# Patient Record
Sex: Male | Born: 1985 | Race: Black or African American | Hispanic: No | Marital: Single | Smoking: Current every day smoker
Health system: Southern US, Community
[De-identification: ages and names within clinical notes are randomized; demographics above are authoritative.]

## PROBLEM LIST (undated history)

## (undated) HISTORY — PX: HAND SURGERY: SHX662

---

## 2004-10-30 ENCOUNTER — Emergency Department: Payer: Self-pay | Admitting: Emergency Medicine

## 2007-06-29 ENCOUNTER — Emergency Department (HOSPITAL_COMMUNITY): Admission: EM | Admit: 2007-06-29 | Discharge: 2007-06-29 | Payer: Self-pay | Admitting: Emergency Medicine

## 2014-08-03 ENCOUNTER — Emergency Department (HOSPITAL_COMMUNITY)
Admission: EM | Admit: 2014-08-03 | Discharge: 2014-08-03 | Disposition: A | Discharge status: Home / Self Care | Payer: Self-pay | Attending: Emergency Medicine | Admitting: Emergency Medicine

## 2014-08-03 ENCOUNTER — Encounter (HOSPITAL_COMMUNITY): Payer: Self-pay

## 2014-08-03 DIAGNOSIS — K029 Dental caries, unspecified: Secondary | ICD-10-CM | POA: Insufficient documentation

## 2014-08-03 DIAGNOSIS — K047 Periapical abscess without sinus: Secondary | ICD-10-CM | POA: Insufficient documentation

## 2014-08-03 DIAGNOSIS — Z720 Tobacco use: Secondary | ICD-10-CM | POA: Insufficient documentation

## 2014-08-03 MED ORDER — HYDROCODONE-ACETAMINOPHEN 5-325 MG PO TABS
1.0000 | ORAL_TABLET | Freq: Once | ORAL | Status: AC
  Administered 2014-08-03: 1 via ORAL
  Filled 2014-08-03: qty 1

## 2014-08-03 MED ORDER — CLINDAMYCIN HCL 150 MG PO CAPS: 150.0000 mg | ORAL_CAPSULE | Freq: Four times a day (QID) | ORAL | Status: DC

## 2014-08-03 MED ORDER — HYDROCODONE-ACETAMINOPHEN 5-325 MG PO TABS: ORAL_TABLET | ORAL | Status: DC

## 2014-08-03 NOTE — Discharge Instructions (Signed)
Dental Abscess °A dental abscess is a collection of infected fluid (pus) from a bacterial infection in the inner part of the tooth (pulp). It usually occurs at the end of the tooth's root.  °CAUSES  °· Severe tooth decay. °· Trauma to the tooth that allows bacteria to enter into the pulp, such as a broken or chipped tooth. °SYMPTOMS  °· Severe pain in and around the infected tooth. °· Swelling and redness around the abscessed tooth or in the mouth or face. °· Tenderness. °· Pus drainage. °· Bad breath. °· Bitter taste in the mouth. °· Difficulty swallowing. °· Difficulty opening the mouth. °· Nausea. °· Vomiting. °· Chills. °· Swollen neck glands. °DIAGNOSIS  °· A medical and dental history will be taken. °· An examination will be performed by tapping on the abscessed tooth. °· X-rays may be taken of the tooth to identify the abscess. °TREATMENT °The goal of treatment is to eliminate the infection. You may be prescribed antibiotic medicine to stop the infection from spreading. A root canal may be performed to save the tooth. If the tooth cannot be saved, it may be pulled (extracted) and the abscess may be drained.  °HOME CARE INSTRUCTIONS °· Only take over-the-counter or prescription medicines for pain, fever, or discomfort as directed by your caregiver. °· Rinse your mouth (gargle) often with salt water (¼ tsp salt in 8 oz [250 ml] of warm water) to relieve pain or swelling. °· Do not drive after taking pain medicine (narcotics). °· Do not apply heat to the outside of your face. °· Return to your dentist for further treatment as directed. °SEEK MEDICAL CARE IF: °· Your pain is not helped by medicine. °· Your pain is getting worse instead of better. °SEEK IMMEDIATE MEDICAL CARE IF: °· You have a fever or persistent symptoms for more than 2-3 days. °· You have a fever and your symptoms suddenly get worse. °· You have chills or a very bad headache. °· You have problems breathing or swallowing. °· You have trouble  opening your mouth. °· You have swelling in the neck or around the eye. °Document Released: 08/22/2005 Document Revised: 05/16/2012 Document Reviewed: 11/30/2010 °ExitCare® Patient Information ©2015 [employer]. This information is not intended to replace advice given to you by your health care provider. Make sure you discuss any questions you have with your health care provider. ° ° °Emergency Department Resource Guide °1) Find a Doctor and Pay Out of Pocket °Although you won't have to find out who is covered by your insurance plan, it is a good idea to ask around and get recommendations. You will then need to call the office and see if the doctor you have chosen will accept you as a new patient and what types of options they offer for patients who are self-pay. Some doctors offer discounts or will set up payment plans for their patients who do not have insurance, but you will need to ask so you aren't surprised when you get to your appointment. ° °2) Contact Your Local Health Department °Not all health departments have doctors that can see patients for sick visits, but many do, so it is worth a call to see if yours does. If you don't know where your local health department is, you can check in your phone book. The CDC also has a tool to help you locate your state's health department, and many state websites also have listings of all of their local health departments. ° °3) Find a Walk-in   Clinic °If your illness is not likely to be very severe or complicated, you may want to try a walk in clinic. These are popping up all over the country in pharmacies, drugstores, and shopping centers. They're usually staffed by nurse practitioners or physician assistants that have been trained to treat common illnesses and complaints. They're usually fairly quick and inexpensive. However, if you have serious medical issues or chronic medical problems, these are probably not your best option. ° °No Primary Care Doctor: °- Call  [facility] at  832-8000 - they can help you locate a primary care doctor that  accepts your insurance, provides certain services, etc. °- Physician Referral Service- 1-800-533-3463 ° °Chronic Pain Problems: °Organization         Address  Phone   Notes  °[facility]  (336) 297-2271 Patients need to be referred by their primary care doctor.  ° °Medication Assistance: °Organization         Address  Phone   Notes  °[place] [facility] [address] °[place], [place] [address] (336) 641-8030 --Must be a resident of [place] °-- Must have NO insurance coverage whatsoever (no Medicaid/ Medicare, etc.) °-- The pt. MUST have a primary care doctor that directs their care regularly and follows them in the community °  °[facility]  (866) 331-1348   °[employer]  (888) 892-1162   ° °Agencies that provide inexpensive medical care: °Organization         Address  Phone   Notes  °[facility]  (336) 832-8035   °[facility]    (336) 832-7272   °[facility] [address] °[place], [place] [address] (336) 832-4777   °[facility] [address], °[place] (336) 271-4999   °[facility]    (336) 373-0678   °[facility]    (336) 272-1050   °[facility] ° [address], [place] Phone:  (336) 832-4444, Fax:  (336) 832-4440 Hours of Operation:  9 am - 6 pm, M-F.  Also accepts Medicaid/Medicare and self-pay.  °[facility] ° [address], [place] Phone: (336) 832-3150, Fax: (336) 832-3151. Hours of Operation:  8:30 am - 5:30 pm, M-F.  Also accepts Medicaid and self-pay.  °[facility] [place] [address], [place] Phone: (336) 878-6027   °[facility] [address], [place], [place] (336)723-1848, Ext. 123 Mondays & Thursdays: 7-9 AM.  First 15 patients are seen on a first come, first serve  basis. °  ° °Medicaid-accepting [place] Providers: ° °Organization         Address  Phone   Notes  °[facility] [address], [place] (336) 641-2100 Also accepts self-pay patients.  °[facility] [address], [place] ° (336) 856-9996   °[facility] [address], [place] (336) 288-8857   °[facility] [address] [place] [address], [place] (336) 299-7000   °Veita Bland [address], [place]  ° (336) 373-1557 Only accepts [place] Access Medicaid patients after they have their name applied to their card.  ° °Self-Pay (no insurance) in [place]: ° °Organization         Address  Phone   Notes  °Sickle Cell Patients, [facility] [address], [place] (  336) 832-1970   °[facility] [address], [place] (336) 832-4400   °[facility] ° [address],  (336) 992-4800   °[facility]/Dr. Osei-Bonsu ° [address] [place] [address], [place] or [address], [place] (336) 841-8500 Phone number for both [place] and [place] locations is the same.  °[facility] [address], [place] (336) 299-0000   °[facility] [address] [place] [address], [place] or [address] (336) 852-7530 °(336) 878-2260   °[facility] [address], [place] (336) 350-1642, phone; (336) 294-5005, fax Sees patients 1st and 3rd Saturday of every month.  Must not qualify for public or private insurance (i.e. Medicaid, Medicare, [place] Health Choice, Veterans' Benefits) • Household income should be no more than 200% of the poverty level •The clinic cannot treat you if you are pregnant or think you are pregnant • Sexually transmitted diseases are not treated at the clinic.  ° ° °Dental Care: °Organization         Address  Phone  Notes  °[place]  [facility] [address], [place] (336) 641-6152 Accepts children up to age 21 who are enrolled in Medicaid or [place] Health Choice; pregnant women with a Medicaid card; and children who have applied for Medicaid or [place] Health Choice, but were declined, whose parents can pay a reduced fee at time of service.  °[facility] [place]  [address], [place] (336) 641-7733 Accepts children up to age 21 who are enrolled in Medicaid or [place] Health Choice; pregnant women with a Medicaid card; and children who have applied for Medicaid or [place] Health Choice, but were declined, whose parents can pay a reduced fee at time of service.  °[facility] ° [address], [place] (336) 641-4533 Patients are seen by appointment only. Walk-ins are not accepted. [facility] will see patients 18 years of age and older. °Monday - Tuesday (8am-5pm) °Most Wednesdays (8:30-5pm) °$30 per visit, cash only  °[facility] ° [address], [place] (336) 641-4533 Patients are seen by appointment only. Walk-ins are not accepted. [facility] will see patients 18 years of age and older. °One Wednesday Evening (Monthly: Volunteer Based).  $30 per visit, cash only  °[facility]  (919) 537-3737 for adults; Children under age 4, call Graduate Pediatric Dentistry at (919) [phone]. Children aged 4-14, please call (919) [phone] to request a pediatric application. ° Dental services are provided in all areas of dental care including fillings, crowns and bridges, complete and partial dentures, implants, gum treatment, root canals, and extractions. Preventive care is also provided. Treatment is provided to both adults and children. °Patients are selected via a lottery and there is often a waiting list. °  °[facility] [address], ° ° (336) 763-8833  [email or web address].drcivils.com °  °[facility] [address], [place], [place] (336)723-1848, Ext. 123 Second and Fourth Thursday of each month, opens at 6:30 AM; Clinic ends at 9 AM.  Patients are seen on a first-come first-served basis, and a limited number are seen during each clinic.  ° °[facility] ° [address], [place], [place] (336) 723-7904   Eligibility Requirements °You must have lived in [place], or [place] counties   for at least the last three months. °  You cannot be eligible for state or federal sponsored healthcare insurance, including Veterans Administration, Medicaid, or Medicare. °  You generally cannot be eligible for healthcare insurance through your employer.  °  How to apply: °Eligibility screenings are held every Tuesday and Wednesday afternoon from 1:00 pm until 4:00 pm. You do not need an appointment for the interview!  °[facility] [address], [place], [place] 336-631-2330   °[facility]  336-342-8273   °[facility]  336-703-3100   °[facility]  336-570-6415   ° °Behavioral Health Resources in the Community: °Intensive Outpatient Programs °Organization         Address  Phone  Notes  °[facility] [address], [place], [place] 336-878-6098   °[facility] [address], [place], [place] 336-832-9800   °[facility] [address], [place], [place] ° 336-882-2125   °[facility] [address],  °[place], [place] 1-800-853-5163 or 336-641-4981   °Substance Abuse Resources °Organization         Address  Phone  Notes  °[facility]  336-882-2125   °[facility]  336-784-9470   °[facility]  336-285-9073   °[facility]  336-845-3988   °Residential & Outpatient Substance Abuse Program  1-800-659-3381   °Psychological Services °Organization          Address  Phone  Notes  °[facility]  336- 832-9600   °[facility]  336- 378-7881   °[facility] [address], [place] 1-800-853-5163 or 336-641-4981   ° °Mobile Crisis Teams °Organization         Address  Phone  Notes  °[facility], Mobile Crisis Care Unit  1-877-626-1772   °Assertive °[facility] ° [address] [place], [place] 336-834-9664   °Sharon DeEsch [address] °[place] 336-554-5454   ° °Self-Help/Support Groups °Organization         Address  Phone             Notes  °[facility] [place] - variety of support groups  336- 373-1402 Call for more information  °Narcotics Anonymous (NA), [facility] [address], °[place]  2 meetings at this location  ° °Residential Treatment Programs °Organization         Address  Phone  Notes  °[facility] [address],    °[place]  1-866-801-8205   °[facility] ° [address], [place], [place] 704-293-8524   °[facility] [address], [place] 336-845-3988 Admissions: 8am-3pm M-F  °[facility] [address]    °[place], [place] 336-841-1104   °[facility] [address], [place], [place] 336-379-7146   °[facility] [address]  °[place], [place] 336-285-9073   °[facility] - Intensive Outpatient [address], [place], [place] 336-852-3033   °[facility] [address]  °[place], [place] 1-877-615-2722 or 336-784-9470   °[facility] [address] , [place] 336-227-7417 Accepts Medicaid  °[facility] [address]  °[place] 1-800-659-3381 Substance Abuse/Addiction Treatment  ° °[place] Behavioral Health Resources °Organization         Address  Phone  Notes  °[facility]  (888)   581-9988   °Julie Brannon, PhD [address] [place], [place]   (336) 349-5553 or (336) 951-0000   ° Behavioral   [address] °[place], [place] (336) 349-4454   °[facility] [address], [place], [place] (336) 342-8316 Insurance/Medicaid/sponsorship through [facility]  °[facility] [address]                                    [place], [place] (336) 342-8316 Therapy/tele-psych/case  °[facility] [address]  ° [place], [place] (336) 349-2233    °Dr. Arfeen  (336) 349-4544   °[facility] [place]  [employer] [place] [facility] 1) [address], [place] °2) [address], [place] °3)  [address], [place] (336) 349-3220 °(336) 342-7768 ° °(336) 342-8140   °[place] [facility] (336) 342-1394 or (336) 342-3537 (After Hours)    ° ° ° °

## 2014-08-03 NOTE — ED Provider Notes (Signed)
CSN: [id number]     Arrival date & time 08/03/14  1850 History   First MD Initiated Contact with Patient 08/03/14 2002     Chief Complaint  Patient presents with  . Oral Swelling     (Consider location/radiation/quality/duration/timing/severity/associated sxs/prior Treatment) HPI   Bernard Tucker is a 28 y.o. male who presents to the Emergency Department complaining of dental pain and swelling to his left lower jaw.  He reports dental pain for several days, but woke up with facial swelling this morning.  He has been taking OTC medications without relief.  He also reports noticing a "knot" to the left lower gums.  He denies throat pain, difficult swallowing or breathing, fever or neck pain.     History reviewed. No pertinent past medical history. Past Surgical History  Procedure Laterality Date  . Hand surgery     No family history on file. History  Substance Use Topics  . Smoking status: Current Every Day Smoker -- 0.50 packs/day    Types: Cigarettes  . Smokeless tobacco: Not on file  . Alcohol Use: No    Review of Systems  Constitutional: Negative for fever and appetite change.  HENT: Positive for dental problem. Negative for congestion, facial swelling, sore throat, trouble swallowing and voice change.   Eyes: Negative for pain and visual disturbance.  Musculoskeletal: Negative for neck pain and neck stiffness.  Neurological: Negative for dizziness, facial asymmetry and headaches.  Hematological: Negative for adenopathy.  All other systems reviewed and are negative.     Allergies  Review of patient's allergies indicates no known allergies.  Home Medications   Prior to Admission medications   Not on File   BP 126/74 mmHg  Pulse 81  Temp(Src) 98.2 F (36.8 C) (Oral)  Resp 20  Ht 6\' 5"  (1.956 m)  Wt 170 lb (77.111 kg)  BMI 20.15 kg/m2  SpO2 100% Physical Exam  Constitutional: He is oriented to person, place, and time. He appears well-developed and  well-nourished. No distress.  HENT:  Head: Normocephalic and atraumatic.  Right Ear: Tympanic membrane and ear canal normal.  Left Ear: Tympanic membrane and ear canal normal.  Mouth/Throat: Uvula is midline, oropharynx is clear and moist and mucous membranes are normal. No oral lesions. No trismus in the jaw. Dental caries present. No dental abscesses or uvula swelling.    Dental caries of the   Localized facial swelling left lower jaw, no obvious dental abscess, trismus, or sublingual abnml.    Neck: Normal range of motion. Neck supple.  Cardiovascular: Normal rate, regular rhythm and normal heart sounds.   No murmur heard. Pulmonary/Chest: Effort normal and breath sounds normal. No respiratory distress.  Musculoskeletal: Normal range of motion.  Lymphadenopathy:    He has no cervical adenopathy.  Neurological: He is alert and oriented to person, place, and time. He exhibits normal muscle tone. Coordination normal.  Skin: Skin is warm and dry.  Nursing note and vitals reviewed.   ED Course  Procedures (including critical care time) Labs Review Labs Reviewed - No data to display  Imaging Review No results found.   EKG Interpretation None      MDM   Final diagnoses:  Abscess, dental   Pt is well appearing.  Localized facial swelling without drainable periapical abscess.  Pt agrees to close f/u with his dentist.  No concerning sx's for infection to the floor of the mouth or deep structures of the neck.  rx for clindamycin and vicodin.  Efstathios Sawin L. Jelitza Manninen, PA-C 08/05/14 0100  Layla MawKristen N Ward, DO 08/06/14 1637

## 2014-08-03 NOTE — ED Notes (Signed)
I think I have an abscessed tooth per pt. Having pain and swelling of lower left gum and tooth. Also have a knot on my lower right gum.

## 2014-10-27 ENCOUNTER — Telehealth: Payer: Self-pay | Admitting: Family Medicine

## 2014-10-27 NOTE — Telephone Encounter (Signed)
Patient advised to try urgent care.

## 2015-07-19 ENCOUNTER — Emergency Department (HOSPITAL_COMMUNITY)
Admission: EM | Admit: 2015-07-19 | Discharge: 2015-07-19 | Disposition: A | Discharge status: Home / Self Care | Payer: No Typology Code available for payment source | Attending: Emergency Medicine | Admitting: Emergency Medicine

## 2015-07-19 ENCOUNTER — Encounter (HOSPITAL_COMMUNITY): Payer: Self-pay | Admitting: *Deleted

## 2015-07-19 ENCOUNTER — Emergency Department (HOSPITAL_COMMUNITY): Payer: No Typology Code available for payment source

## 2015-07-19 DIAGNOSIS — S40011A Contusion of right shoulder, initial encounter: Secondary | ICD-10-CM | POA: Insufficient documentation

## 2015-07-19 DIAGNOSIS — Y998 Other external cause status: Secondary | ICD-10-CM | POA: Insufficient documentation

## 2015-07-19 DIAGNOSIS — Y9389 Activity, other specified: Secondary | ICD-10-CM | POA: Insufficient documentation

## 2015-07-19 DIAGNOSIS — K0889 Other specified disorders of teeth and supporting structures: Secondary | ICD-10-CM | POA: Insufficient documentation

## 2015-07-19 DIAGNOSIS — F17210 Nicotine dependence, cigarettes, uncomplicated: Secondary | ICD-10-CM | POA: Diagnosis not present

## 2015-07-19 DIAGNOSIS — S4991XA Unspecified injury of right shoulder and upper arm, initial encounter: Secondary | ICD-10-CM | POA: Insufficient documentation

## 2015-07-19 DIAGNOSIS — Y92410 Unspecified street and highway as the place of occurrence of the external cause: Secondary | ICD-10-CM | POA: Insufficient documentation

## 2015-07-19 MED ORDER — IBUPROFEN 800 MG PO TABS: 800.0000 mg | ORAL_TABLET | Freq: Three times a day (TID) | ORAL | Status: AC

## 2015-07-19 MED ORDER — PENICILLIN V POTASSIUM 500 MG PO TABS: 500.0000 mg | ORAL_TABLET | Freq: Four times a day (QID) | ORAL | Status: AC

## 2015-07-19 MED ORDER — IBUPROFEN 800 MG PO TABS
800.0000 mg | ORAL_TABLET | Freq: Once | ORAL | Status: AC
  Administered 2015-07-19: 800 mg via ORAL
  Filled 2015-07-19: qty 1

## 2015-07-19 NOTE — ED Notes (Signed)
Discharge papers and scripts given to pt - discussed pain relief-- use of ice and heat , and expectation for increase discomfort over next day or 2 . Ice pack provided for comfort/use at home. Also encouraged pt to call to make appointment for dentist visit ready for when his insurance kicks in ( in 15 days ) Verbalized understanding
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
## 2015-07-19 NOTE — Discharge Instructions (Signed)
xrays are normal - motrin 3 times daily Penicillin for toothache See dentist this week

## 2020-12-01 ENCOUNTER — Encounter (HOSPITAL_COMMUNITY): Payer: Self-pay | Admitting: *Deleted

## 2020-12-01 ENCOUNTER — Emergency Department (HOSPITAL_COMMUNITY)
Admission: EM | Admit: 2020-12-01 | Discharge: 2020-12-01 | Disposition: A | Discharge status: Home / Self Care | Payer: Self-pay | Attending: Emergency Medicine | Admitting: Emergency Medicine

## 2020-12-01 ENCOUNTER — Other Ambulatory Visit: Payer: Self-pay

## 2020-12-01 ENCOUNTER — Emergency Department (HOSPITAL_COMMUNITY): Payer: Self-pay

## 2020-12-01 DIAGNOSIS — R1031 Right lower quadrant pain: Secondary | ICD-10-CM | POA: Insufficient documentation

## 2020-12-01 DIAGNOSIS — N433 Hydrocele, unspecified: Secondary | ICD-10-CM | POA: Insufficient documentation

## 2020-12-01 DIAGNOSIS — F17210 Nicotine dependence, cigarettes, uncomplicated: Secondary | ICD-10-CM | POA: Insufficient documentation

## 2020-12-01 LAB — CBC
HCT: 51.3 % (ref 39.0–52.0)
Hemoglobin: 17.1 g/dL — ABNORMAL HIGH (ref 13.0–17.0)
MCH: 29.7 pg (ref 26.0–34.0)
MCHC: 33.3 g/dL (ref 30.0–36.0)
MCV: 89.1 fL (ref 80.0–100.0)
Platelets: 401 10*3/uL — ABNORMAL HIGH (ref 150–400)
RBC: 5.76 MIL/uL (ref 4.22–5.81)
RDW: 12.8 % (ref 11.5–15.5)
WBC: 9.1 10*3/uL (ref 4.0–10.5)
nRBC: 0 % (ref 0.0–0.2)

## 2020-12-01 LAB — COMPREHENSIVE METABOLIC PANEL
ALT: 17 U/L (ref 0–44)
AST: 16 U/L (ref 15–41)
Albumin: 4.2 g/dL (ref 3.5–5.0)
Alkaline Phosphatase: 62 U/L (ref 38–126)
Anion gap: 9 (ref 5–15)
BUN: 10 mg/dL (ref 6–20)
CO2: 27 mmol/L (ref 22–32)
Calcium: 9.2 mg/dL (ref 8.9–10.3)
Chloride: 100 mmol/L (ref 98–111)
Creatinine, Ser: 1.09 mg/dL (ref 0.61–1.24)
GFR, Estimated: >60 mL/min (ref >60)
Glucose, Bld: 97 mg/dL (ref 70–99)
Potassium: 3.7 mmol/L (ref 3.5–5.1)
Sodium: 136 mmol/L (ref 135–145)
Total Bilirubin: 0.8 mg/dL (ref 0.3–1.2)
Total Protein: 8.4 g/dL — ABNORMAL HIGH (ref 6.5–8.1)

## 2020-12-01 LAB — LIPASE, BLOOD: Lipase: 80 U/L — ABNORMAL HIGH (ref 11–51)

## 2020-12-01 LAB — URINALYSIS, ROUTINE W REFLEX MICROSCOPIC
Bilirubin Urine: NEGATIVE
Glucose, UA: NEGATIVE mg/dL
Hgb urine dipstick: NEGATIVE
Ketones, ur: NEGATIVE mg/dL
Leukocytes,Ua: NEGATIVE
Nitrite: NEGATIVE
Protein, ur: NEGATIVE mg/dL
Specific Gravity, Urine: 1.026 (ref 1.005–1.030)
pH: 6 (ref 5.0–8.0)

## 2020-12-01 MED ORDER — ONDANSETRON 4 MG PO TBDP: 4.0000 mg | ORAL_TABLET | Freq: Three times a day (TID) | ORAL | 0 refills | Status: AC | PRN

## 2020-12-01 MED ORDER — IOHEXOL 300 MG/ML  SOLN
100.0000 mL | Freq: Once | INTRAMUSCULAR | Status: AC | PRN
  Administered 2020-12-01: 100 mL via INTRAVENOUS

## 2020-12-01 NOTE — ED Triage Notes (Signed)
Abdominal pain right lower quadrant for 4 days, vomited x 2

## 2020-12-01 NOTE — ED Notes (Signed)
ED Provider at bedside. 

## 2020-12-01 NOTE — ED Provider Notes (Signed)
[facility] EMERGENCY DEPARTMENT Provider Note   CSN: [id number] Arrival date & time: 12/01/20  1614     History Chief Complaint  Patient presents with  . Abdominal Pain    Bernard Tucker is a 35 y.o. male.  HPI Patient is a 35 year old male who presents the emergency department due to abdominal pain.  Patient states his symptoms started about 4 days ago.  He states his pain is along the right lower quadrant.  He states that about 3 days ago he was feeling extremely fatigued and having intermittent nausea and vomiting throughout the day.  He then vomited one more time the next day.  His vomiting is resolved but he has had continued nausea.  No fevers, chills, chest pain, shortness of breath, diarrhea, or urinary complaints.  He denies a surgical history to his abdomen.    History reviewed. No pertinent past medical history.  There are no problems to display for this patient.   Past Surgical History:  Procedure Laterality Date  . HAND SURGERY         No family history on file.  Social History   Tobacco Use  . Smoking status: Current Every Day Smoker    Packs/day: 0.50    Types: Cigarettes  . Smokeless tobacco: Former Engineer, water Use Topics  . Alcohol use: No  . Drug use: No    Home Medications Prior to Admission medications   Medication Sig Start Date End Date Taking? Authorizing Provider  ondansetron (ZOFRAN ODT) 4 MG disintegrating tablet Take 1 tablet (4 mg total) by mouth every 8 (eight) hours as needed for nausea or vomiting. 12/01/20  Yes Placido Sou, PA-C  acetaminophen (TYLENOL) 500 MG tablet Take 1,500 mg by mouth every 6 (six) hours as needed for moderate pain.    [provider]  ibuprofen (ADVIL,MOTRIN) 800 MG tablet Take 1 tablet (800 mg total) by mouth 3 (three) times daily. 07/19/15   Eber Hong, MD  penicillin v potassium (VEETID) 500 MG tablet Take 1 tablet (500 mg total) by mouth 4 (four) times daily. 07/19/15   Eber Hong, MD    Allergies    Patient has no known allergies.  Review of Systems   Review of Systems  All other systems reviewed and are negative. Ten systems reviewed and are negative for acute change, except as noted in the HPI.    Physical Exam Updated Vital Signs BP (!) 132/98   Pulse 86   Temp 98.1 F (36.7 C) (Oral)   Resp 18   Ht 6\' 5"  (1.956 m)   Wt 85.3 kg   SpO2 100%   BMI 22.29 kg/m   Physical Exam Vitals and nursing note reviewed.  Constitutional:      General: He is not in acute distress.    Appearance: Normal appearance. He is not ill-appearing, toxic-appearing or diaphoretic.  HENT:     Head: Normocephalic and atraumatic.     Right Ear: External ear normal.     Left Ear: External ear normal.     Nose: Nose normal.     Mouth/Throat:     Mouth: Mucous membranes are moist.     Pharynx: Oropharynx is clear. No oropharyngeal exudate or posterior oropharyngeal erythema.  Eyes:     Extraocular Movements: Extraocular movements intact.  Cardiovascular:     Rate and Rhythm: Normal rate and regular rhythm.     Pulses: Normal pulses.     Heart sounds: Normal heart sounds. No murmur  heard. No friction rub. No gallop.   Pulmonary:     Effort: Pulmonary effort is normal. No respiratory distress.     Breath sounds: Normal breath sounds. No stridor. No wheezing, rhonchi or rales.  Abdominal:     General: Abdomen is flat. There is no distension.     Palpations: Abdomen is soft.     Tenderness: There is abdominal tenderness in the right lower quadrant. Positive signs include McBurney's sign. Negative signs include Murphy's sign and Rovsing's sign.  Musculoskeletal:        General: Normal range of motion.     Cervical back: Normal range of motion and neck supple. No tenderness.  Skin:    General: Skin is warm and dry.  Neurological:     General: No focal deficit present.     Mental Status: He is alert and oriented to person, place, and time.  Psychiatric:         Mood and Affect: Mood normal.        Behavior: Behavior normal.    ED Results / Procedures / Treatments   Labs (all labs ordered are listed, but only abnormal results are displayed) Labs Reviewed  LIPASE, BLOOD - Abnormal; Notable for the following components:      Result Value   Lipase 80 (*)    All other components within normal limits  COMPREHENSIVE METABOLIC PANEL - Abnormal; Notable for the following components:   Total Protein 8.4 (*)    All other components within normal limits  CBC - Abnormal; Notable for the following components:   Hemoglobin 17.1 (*)    Platelets 401 (*)    All other components within normal limits  URINALYSIS, ROUTINE W REFLEX MICROSCOPIC    EKG None  Radiology CT ABDOMEN PELVIS W CONTRAST  Result Date: 12/01/2020 CLINICAL DATA:  Right lower quadrant pain with nausea and vomiting for 4 days EXAM: CT ABDOMEN AND PELVIS WITH CONTRAST TECHNIQUE: Multidetector CT imaging of the abdomen and pelvis was performed using the standard protocol following bolus administration of intravenous contrast. CONTRAST:  OMNIPAQUE IOHEXOL 300 MG/ML  SOLN COMPARISON:  None. FINDINGS: Lower chest: Paraseptal emphysematous changes in the otherwise clear lung bases. Normal heart size. No pericardial effusion. Hepatobiliary: No worrisome focal liver abnormality is seen. Normal gallbladder. No visible calcified gallstones. No biliary ductal dilatation. Pancreas: No pancreatic ductal dilatation or surrounding inflammatory changes. Spleen: Normal in size. No concerning splenic lesions. Adrenals/Urinary Tract: Normal adrenal glands. Kidneys are normally located with symmetric enhancement. No suspicious renal lesion, urolithiasis or hydronephrosis. Urinary bladder is largely decompressed at the time of exam and therefore poorly evaluated by CT imaging. Mild bladder wall thickening likely related to underdistention. Stomach/Bowel: Distal esophagus unremarkable. Small air and fluid-filled  gastric diverticulum seen arising from the greater curvature (2/17). Distal stomach is unremarkable. Small air-filled duodenal diverticulum noted as well (2/37). No significant small bowel thickening or dilatation. Normal appearing partly air-filled appendix seen in the right quadrant. No colonic dilatation or wall thickening. Vascular/Lymphatic: No significant vascular findings are present. No enlarged abdominal or pelvic lymph nodes. Reproductive: The prostate and seminal vesicles are unremarkable. Trace right hydrocele. Punctate radiodensities of the spermatic cords, could correlate for history of vasectomy. Other: No abdominopelvic free fluid or free gas. No bowel containing hernias. Musculoskeletal: No acute osseous abnormality or suspicious osseous lesion. Transitional lumbosacral vertebrae including fusion of the left transverse process and adjacent sacral ala. IMPRESSION: 1. No acute abdominopelvic abnormality to provide a CT explanation for patient's  symptoms. 2. 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 3. Trace right hydrocele. Punctate radiodensities of the spermatic cords, correlate for prior vasectomy. 4. Mild Paraseptal emphysematous changes bases ( Emphysema (ICD10-J43.9).) Electronically Signed   By: Kreg Shropshire M.D.   On: 12/01/2020 18:37   Procedures Procedures   Medications Ordered in ED Medications  iohexol (OMNIPAQUE) 300 MG/ML solution 100 mL (100 mLs Intravenous Contrast Given 12/01/20 1817)   ED Course  I have reviewed the triage vital signs and the nursing notes.  Pertinent labs & imaging results that were available during my care of the patient were reviewed by me and considered in my medical decision making (see chart for details).    MDM Rules/Calculators/A&P                          Pt is a 35 y.o. male who presents the emergency department due to right lower quadrant abdominal  pain.  Labs: CBC with a hemoglobin of 17.1 and platelets of 401. Lipase of 80. CMP with a total protein of 8.4. UA negative.  Imaging: CT scan of the abdomen and pelvis with IV contrast shows no acute abdominopelvic abnormalities to provide CT explanation for patient's symptoms.  I, Placido Sou, PA-C, personally reviewed and evaluated these images and lab results as part of my medical decision-making.  Unsure of the cause of his symptoms.  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  Possibly muscular injury, though this does not explain his nausea and vomiting.  Consideration for viral gastroenteritis.  Radiology does also note a small hydrocele on the right as well as punctate radiodensities within the spermatic cords.  They recommended that we correlate for prior vasectomy.  Patient denies any history of vasectomy.  Denies any testicular pain at this time but notes intermittent pain in the past.  We will give him a referral to urology.  We will discharge patient on a course of Zofran.  PCP follow-up.  Return to the emergency department if his symptoms worsen.  Feel that he is stable for discharge at this time and he is agreeable.  His questions were answered and he was amicable at the time of discharge.  Note: Portions of this report may have been transcribed using voice recognition software. Every effort was made to ensure accuracy; however, inadvertent computerized transcription errors may be present.   Final Clinical Impression(s) / ED Diagnoses Final diagnoses:  Right lower quadrant abdominal pain  Hydrocele, unspecified hydrocele type   Rx / DC Orders ED Discharge Orders         Ordered    ondansetron (ZOFRAN ODT) 4 MG disintegrating tablet  Every 8 hours PRN        12/01/20 1854           Placido Sou, PA-C 12/01/20 1857    Bethann [facility], MD 12/03/20 2243

## 2020-12-01 NOTE — Discharge Instructions (Addendum)
Like we discussed, I am going to prescribe you a medication called Zofran.  This medication helps with nausea and vomiting.  You can take it up to 3 times a day.  Please only take this if you are experiencing severe nausea and vomiting that keeps you from being able to eat and stay adequately hydrated.  I am giving you a referral to Dr. Alvester Morin.  He is a Emergency planning/management officer.  Please follow-up with him to have them evaluate your hydrocele in the right testicle.  You can also discuss your intermittent testicular pain.  If your symptoms worsen, you can always return to the emergency department.  Please make sure you follow-up with your regular doctor.

## 2021-05-12 ENCOUNTER — Emergency Department (HOSPITAL_COMMUNITY)
Admission: EM | Admit: 2021-05-12 | Discharge: 2021-05-12 | Disposition: A | Discharge status: Home / Self Care | Payer: No Typology Code available for payment source | Attending: Emergency Medicine | Admitting: Emergency Medicine

## 2021-05-12 ENCOUNTER — Other Ambulatory Visit: Payer: Self-pay

## 2021-05-12 ENCOUNTER — Emergency Department (HOSPITAL_COMMUNITY): Payer: No Typology Code available for payment source

## 2021-05-12 ENCOUNTER — Encounter (HOSPITAL_COMMUNITY): Payer: Self-pay

## 2021-05-12 DIAGNOSIS — R519 Headache, unspecified: Secondary | ICD-10-CM | POA: Insufficient documentation

## 2021-05-12 DIAGNOSIS — M25512 Pain in left shoulder: Secondary | ICD-10-CM | POA: Insufficient documentation

## 2021-05-12 DIAGNOSIS — F17210 Nicotine dependence, cigarettes, uncomplicated: Secondary | ICD-10-CM | POA: Insufficient documentation

## 2021-05-12 DIAGNOSIS — M545 Low back pain, unspecified: Secondary | ICD-10-CM | POA: Insufficient documentation

## 2021-05-12 MED ORDER — CYCLOBENZAPRINE HCL 10 MG PO TABS: 10.0000 mg | ORAL_TABLET | Freq: Every evening | ORAL | 0 refills | Status: AC | PRN
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
## 2021-05-12 NOTE — Discharge Instructions (Addendum)
I am prescribing you a strong muscle relaxer called flexeril. Please only take this medication once in the evening with dinner. This medication can make you quite drowsy. Do not mix it with alcohol. Do not drive a vehicle after taking it.   I recommend a combination of tylenol and ibuprofen for management of your pain. You can take a low dose of both at the same time. I recommend 500 mg of Tylenol combined with 600 mg of ibuprofen. This is one maximum strength Tylenol and three regular ibuprofen. You can take these 2-3 times for day for your pain. Please try to take these medications with a small amount of food as well to prevent upsetting your stomach.  Also, please consider topical pain relieving creams such as Voltaran Gel, BioFreeze, or Icy Hot. There is also a pain relieving cream made by [employer]. You should be able to find all of these at your local pharmacy.   Please continue to monitor your symptoms closely.  If you develop any new or worsening symptoms please come back to the emergency department.  It was a pleasure to meet you.

## 2021-12-15 ENCOUNTER — Other Ambulatory Visit: Payer: Self-pay

## 2021-12-15 ENCOUNTER — Emergency Department (HOSPITAL_COMMUNITY)
Admission: EM | Admit: 2021-12-15 | Discharge: 2021-12-15 | Disposition: A | Discharge status: Home / Self Care | Payer: 59 | Attending: Student | Admitting: Student

## 2021-12-15 ENCOUNTER — Emergency Department (HOSPITAL_COMMUNITY): Payer: 59

## 2021-12-15 ENCOUNTER — Encounter (HOSPITAL_COMMUNITY): Payer: Self-pay

## 2021-12-15 DIAGNOSIS — Y9289 Other specified places as the place of occurrence of the external cause: Secondary | ICD-10-CM | POA: Diagnosis not present

## 2021-12-15 DIAGNOSIS — Y9389 Activity, other specified: Secondary | ICD-10-CM | POA: Diagnosis not present

## 2021-12-15 DIAGNOSIS — Y990 Civilian activity done for income or pay: Secondary | ICD-10-CM | POA: Insufficient documentation

## 2021-12-15 DIAGNOSIS — X500XXA Overexertion from strenuous movement or load, initial encounter: Secondary | ICD-10-CM | POA: Diagnosis not present

## 2021-12-15 DIAGNOSIS — S9031XA Contusion of right foot, initial encounter: Secondary | ICD-10-CM | POA: Diagnosis not present

## 2021-12-15 DIAGNOSIS — S90921A Unspecified superficial injury of right foot, initial encounter: Secondary | ICD-10-CM | POA: Diagnosis present

## 2021-12-15 NOTE — Discharge Instructions (Signed)
Likely you have a bruise on your foot, should heal on its own, given you a postop shoe you may use as needed for comfort.  When the foot is not in use please keep elevate apply ice to the area.  May use over-the-counter pain medication as needed. ? ?Please follow-up with orthopedics if symptoms improve other weeks time. ? ?Come back to the emergency department if you develop chest pain, shortness of breath, severe abdominal pain, uncontrolled nausea, vomiting, diarrhea. ? ?

## 2021-12-15 NOTE — ED Triage Notes (Signed)
Patient with right foot pain after dropping heavy object into foot while at work.  ?

## 2021-12-15 NOTE — ED Provider Notes (Signed)
?[facility]MENT ?Provider Note ? ? ?CSN: [id number] ?Arrival date & time: 12/15/21  9833 ? ?  ? ?History ? ?Chief Complaint  ?Patient presents with  ? Foot Injury  ? ? ?Bernard Tucker is a 36 y.o. male. ? ?HPI ? ?Patient with significant medical history presents with complaints of right foot pain.  Patient states that he was at work today and was lifting up a heavy object at waist level and dropped onto his foot, states he is unsure of how heavy it was but was pretty heavy.  States he landed on the dorsum of his right foot, states after the incident he has pain in that area, is worse with ambulation improved with rest, denies any paresthesia readings wound down to his toes, his only complaints this time, has not had anything for pain management. ? ?Patient's partner is at bedside able to validate the story. ? ?Home Medications ?Prior to Admission medications   ?Medication Sig Start Date End Date Taking? Authorizing Provider  ?acetaminophen (TYLENOL) 500 MG tablet Take 1,500 mg by mouth every 6 (six) hours as needed for moderate pain.    [provider]  ?cyclobenzaprine (FLEXERIL) 10 MG tablet Take 1 tablet (10 mg total) by mouth at bedtime as needed for muscle spasms. 05/12/21   Placido Sou, PA-C  ?ibuprofen (ADVIL,MOTRIN) 800 MG tablet Take 1 tablet (800 mg total) by mouth 3 (three) times daily. 07/19/15   Eber Hong, MD  ?ondansetron (ZOFRAN ODT) 4 MG disintegrating tablet Take 1 tablet (4 mg total) by mouth every 8 (eight) hours as needed for nausea or vomiting. 12/01/20   Placido Sou, PA-C  ?penicillin v potassium (VEETID) 500 MG tablet Take 1 tablet (500 mg total) by mouth 4 (four) times daily. 07/19/15   Eber Hong, MD  ?   ? ?Allergies    ?Patient has no known allergies.   ? ?Review of Systems   ?Review of Systems  ?Constitutional:  Negative for chills and fever.  ?Respiratory:  Negative for shortness of breath.   ?Cardiovascular:  Negative for chest pain.   ?Gastrointestinal:  Negative for abdominal pain.  ?Musculoskeletal:   ?     Right foot pain  ?Neurological:  Negative for headaches.  ? ?Physical Exam ?Updated Vital Signs ?BP (!) 125/104 (BP Location: Right Arm)   Pulse 99   Temp 98.1 ?F (36.7 ?C) (Oral)   Resp 16   Ht 6\' 5"  (1.956 m)   Wt 83.9 kg   SpO2 100%   BMI 21.94 kg/m?  ?Physical Exam ?Vitals and nursing note reviewed.  ?Constitutional:   ?   General: He is not in acute distress. ?   Appearance: He is not ill-appearing.  ?HENT:  ?   Head: Normocephalic and atraumatic.  ?   Nose: No congestion.  ?Eyes:  ?   Conjunctiva/sclera: Conjunctivae normal.  ?Cardiovascular:  ?   Rate and Rhythm: Normal rate and regular rhythm.  ?   Pulses: Normal pulses.  ?Pulmonary:  ?   Effort: Pulmonary effort is normal.  ?   Breath sounds: Normal breath sounds.  ?Musculoskeletal:  ?   Comments: Right foot was visualized there is no gross deformities noted, no edema present no erythema, full range of motion his toes ankle and knee, he has 2+ dorsal pedal pulses, neurovascular fully intact, he is tender to palpation at the proximal on the first and second metatarsals.  ?Skin: ?   General: Skin is warm and dry.  ?Neurological:  ?  Mental Status: He is alert.  ?Psychiatric:     ?   Mood and Affect: Mood normal.  ? ? ?ED Results / Procedures / Treatments   ?Labs ?(all labs ordered are listed, but only abnormal results are displayed) ?Labs Reviewed - No data to display ? ?EKG ?None ? ?Radiology ?DG Foot Complete Right ? ?Result Date: 12/15/2021 ?CLINICAL DATA:  Right foot pain after injury. EXAM: RIGHT FOOT COMPLETE - 3+ VIEW COMPARISON:  None. FINDINGS: There is no evidence of fracture or dislocation. There is no evidence of arthropathy or other focal bone abnormality. Soft tissues are unremarkable. IMPRESSION: Negative. Electronically Signed   By: Lupita Raider M.D.   On: 12/15/2021 10:44   ? ?Procedures ?Procedures  ? ? ?Medications Ordered in ED ?Medications - No data to  display ? ?ED Course/ Medical Decision Making/ A&P ?  ?                        ?Medical Decision Making ?Amount and/or Complexity of Data Reviewed ?Radiology: ordered. ? ? ?This patient presents to the ED for concern of right foot pain, this involves an extensive number of treatment options, and is a complaint that carries with it a high risk of complications and morbidity.  The differential diagnosis includes fracture, dislocation, compartment syndrome ? ? ? ?Additional history obtained: ? ?Additional history obtained from partner who is at bedside ?External records from outside source obtained and reviewed including previous imaging ? ? ?Co morbidities that complicate the patient evaluation ? ?N/A ? ?Social Determinants of Health: ? ?N/A ? ? ? ?Lab Tests: ? ?I Ordered, and personally interpreted labs.  The pertinent results include: Benign ? ? ?Imaging Studies ordered: ? ?I ordered imaging studies including x-ray of right foot ?I independently visualized and interpreted imaging which showed negative acute findings ?I agree with the radiologist interpretation ? ? ?Cardiac Monitoring: ? ?The patient was maintained on a cardiac monitor.  I personally viewed and interpreted the cardiac monitored which showed an underlying rhythm of: N/A ? ? ?Medicines ordered and prescription drug management: ? ?I ordered medication including N/A ?I have reviewed the patients home medicines and have made adjustments as needed ? ?Critical Interventions: ? ?N/A ? ? ? ?Reevaluation: ? ?Presents with right foot pain tender on palpation concern for orthopedic injury ? ?Imaging was negative for acute findings, patient placed in postop shoe and is ready for discharge. ? ?Consultations Obtained: ? ?N/A ? ? ? ? ?Test Considered: ? ?N/A ? ? ? ?Rule out ?I have low suspicion for septic arthritis as patient denies IV drug use, skin exam was performed no erythematous, edematous, warm joints noted on exam, no new heart murmur heard on exam.  Low  suspicion for fracture or dislocation as x-ray does not feel any significant findings. low suspicion for ligament or tendon damage as area was palpated no gross defects noted, they had full range of motion as well as 5/5 strength.  Low suspicion for compartment syndrome as area was palpated it was soft to the touch, neurovascular fully intact. ? ? ? ? ?Dispostion and problem list ? ?After consideration of the diagnostic results and the patients response to treatment, I feel that the patent would benefit from discharge. ? ?Right foot pain-likely bruised muscle from trauma, will provide him with a postop shoe, recommend over-the-counter pain medication follow-up with orthopedic as needed.  Given strict return precautions. ? ? ? ? ? ? ? ? ? ? ? ?  Final Clinical Impression(s) / ED Diagnoses ?Final diagnoses:  ?Contusion of right foot, initial encounter  ? ? ?Rx / DC Orders ?ED Discharge Orders   ? ? None  ? ?  ? ? ?  ?Carroll SageFaulkner, Luigi Stuckey J, PA-C ?12/15/21 1147 ? ?  ?Glendora ScoreKommor, [place], MD ?12/15/21 1555 ? ?

## 2022-12-19 ENCOUNTER — Encounter (HOSPITAL_COMMUNITY): Payer: Self-pay | Admitting: Emergency Medicine

## 2022-12-19 ENCOUNTER — Other Ambulatory Visit: Payer: Self-pay

## 2022-12-19 ENCOUNTER — Emergency Department (HOSPITAL_COMMUNITY)
Admission: EM | Admit: 2022-12-19 | Discharge: 2022-12-19 | Disposition: A | Discharge status: Home / Self Care | Payer: Managed Care, Other (non HMO) | Attending: Emergency Medicine | Admitting: Emergency Medicine

## 2022-12-19 ENCOUNTER — Emergency Department (HOSPITAL_COMMUNITY): Payer: Managed Care, Other (non HMO)

## 2022-12-19 DIAGNOSIS — M79672 Pain in left foot: Secondary | ICD-10-CM | POA: Diagnosis present

## 2022-12-19 MED ORDER — DICLOFENAC SODIUM 1 % EX GEL
2.0000 g | Freq: Four times a day (QID) | CUTANEOUS | Status: DC
  Administered 2022-12-19: 2 g via TOPICAL
  Filled 2022-12-19: qty 100

## 2022-12-19 MED ORDER — PREDNISONE 20 MG PO TABS: 40.0000 mg | ORAL_TABLET | Freq: Every day | ORAL | 0 refills | Status: AC

## 2022-12-19 MED ORDER — KETOROLAC TROMETHAMINE 15 MG/ML IJ SOLN
15.0000 mg | Freq: Once | INTRAMUSCULAR | Status: AC
  Administered 2022-12-19: 15 mg via INTRAMUSCULAR
  Filled 2022-12-19: qty 1

## 2022-12-19 NOTE — Discharge Instructions (Addendum)
As discussed, you likely have inflammation in the outside part of your left foot.  This may be due to bone inflammation or insertion of the ligament.  Please follow-up with one of our foot specialists in about 1 week.

## 2022-12-19 NOTE — ED Triage Notes (Signed)
Pt c/o knot on bottom of left foot x 2 weeks. Nad. Small knot noted to left middle lateral area of foot. Pain worse with walking.

## 2022-12-19 NOTE — ED Notes (Signed)
Dc instructions and scripts reviewed with pt no questions or concerns at this time. Walked out of ED with spouse at side. Will follow up with foot doctor.

## 2022-12-19 NOTE — ED Provider Notes (Signed)
[facility] EMERGENCY DEPARTMENT AT [facility] Provider Note   CSN: [id number] Arrival date & time: 12/19/22  4098     History  Chief Complaint  Patient presents with   Foot Pain    Bernard Tucker is a 37 y.o. male.  HPI Well-appearing adult male presents with pain at the base of the fifth metatarsal, left. 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 No fall, trauma, twisting.  Over the past few days he has developed severe pain in that area, prohibiting him from working in a typical manner.  Well-appearing adult male presents with    Home Medications Prior to Admission medications   Medication Sig Start Date End Date Taking? Authorizing Provider  predniSONE (DELTASONE) 20 MG tablet Take 2 tablets (40 mg total) by mouth daily with breakfast. For the next four days 12/19/22  Yes Gerhard Munch, MD  acetaminophen (TYLENOL) 500 MG tablet Take 1,500 mg by mouth every 6 (six) hours as needed for moderate pain.    [provider]  cyclobenzaprine (FLEXERIL) 10 MG tablet Take 1 tablet (10 mg total) by mouth at bedtime as needed for muscle spasms. 05/12/21   Placido Sou, PA-C  ibuprofen (ADVIL,MOTRIN) 800 MG tablet Take 1 tablet (800 mg total) by mouth 3 (three) times daily. 07/19/15   Eber Hong, MD  ondansetron (ZOFRAN ODT) 4 MG disintegrating tablet Take 1 tablet (4 mg total) by mouth every 8 (eight) hours as needed for nausea or vomiting. 12/01/20   Placido Sou, PA-C  penicillin v potassium (VEETID) 500 MG tablet Take 1 tablet (500 mg total) by mouth 4 (four) times daily. 07/19/15   Eber Hong, MD      Allergies    Patient has no known allergies.    Review of Systems   Review of Systems  All other systems reviewed and are negative.   Physical Exam Updated Vital Signs BP 123/81   Pulse 66   Temp 97.7 F (36.5 C) (Oral)   Resp 17   SpO2 99%  Physical Exam Vitals and nursing note reviewed.   Constitutional:      General: He is not in acute distress.    Appearance: He is well-developed.  HENT:     Head: Normocephalic and atraumatic.  Eyes:     Conjunctiva/sclera: Conjunctivae normal.  Cardiovascular:     Rate and Rhythm: Normal rate and regular rhythm.     Pulses: Normal pulses.  Pulmonary:     Effort: Pulmonary effort is normal. No respiratory distress.     Breath sounds: No stridor.  Abdominal:     General: There is no distension.  Musculoskeletal:       Legs:  Skin:    General: Skin is warm and dry.  Neurological:     Mental Status: He is alert and oriented to person, place, and time.     ED Results / Procedures / Treatments   Labs (all labs ordered are listed, but only abnormal results are displayed) Labs Reviewed - No data to display  EKG None  Radiology DG Foot Complete Left  Result Date: 12/19/2022 CLINICAL DATA:  Knot on the left foot for the past 2 weeks. EXAM: LEFT FOOT - COMPLETE 3+ VIEW COMPARISON:  None Available. FINDINGS: There is no evidence of fracture or dislocation. There is no evidence of arthropathy or other focal bone abnormality. Soft tissues are unremarkable. IMPRESSION: Negative. Electronically Signed   By: Vickki Hearing.D.  On: 12/19/2022 12:32    Procedures Procedures    Medications Ordered in ED Medications  ketorolac (TORADOL) 15 MG/ML injection 15 mg (has no administration in time range)  diclofenac Sodium (VOLTAREN) 1 % topical gel 2 g (has no administration in time range)    ED Course/ Medical Decision Making/ A&P                             Medical Decision Making Left foot pain.  Differential includes stress fracture, Jones versus pseudo Jones versus enthesis no superficial changing suggesting infection. X-ray ordered, Toradol ordered  Amount and/or Complexity of Data Reviewed Independent Historian: spouse Radiology: ordered and independent interpretation performed. Decision-making details documented in ED  Course.  Risk Prescription drug management.   Adult male with new left foot pain, likely overuse injury.  X-ray without evidence for fracture, suspicion for soft tissue inflammation.  Patient immobilized with a cam walker, received Toradol here, anti-inflammatories, will follow-up with orthopedics.       Final Clinical Impression(s) / ED Diagnoses Final diagnoses:  Foot pain, left    Rx / DC Orders ED Discharge Orders          Ordered    predniSONE (DELTASONE) 20 MG tablet  Daily with breakfast        12/19/22 1259              Gerhard Munch, MD 12/19/22 1259

## 2023-07-06 IMAGING — DX DG FOOT COMPLETE 3+V*R*
3 series · 3 of 3 positions shown · non-contrast
Comparison: None.

CLINICAL DATA: Right foot pain after injury.

EXAM:
RIGHT FOOT COMPLETE - 3+ VIEW

[foot ap]
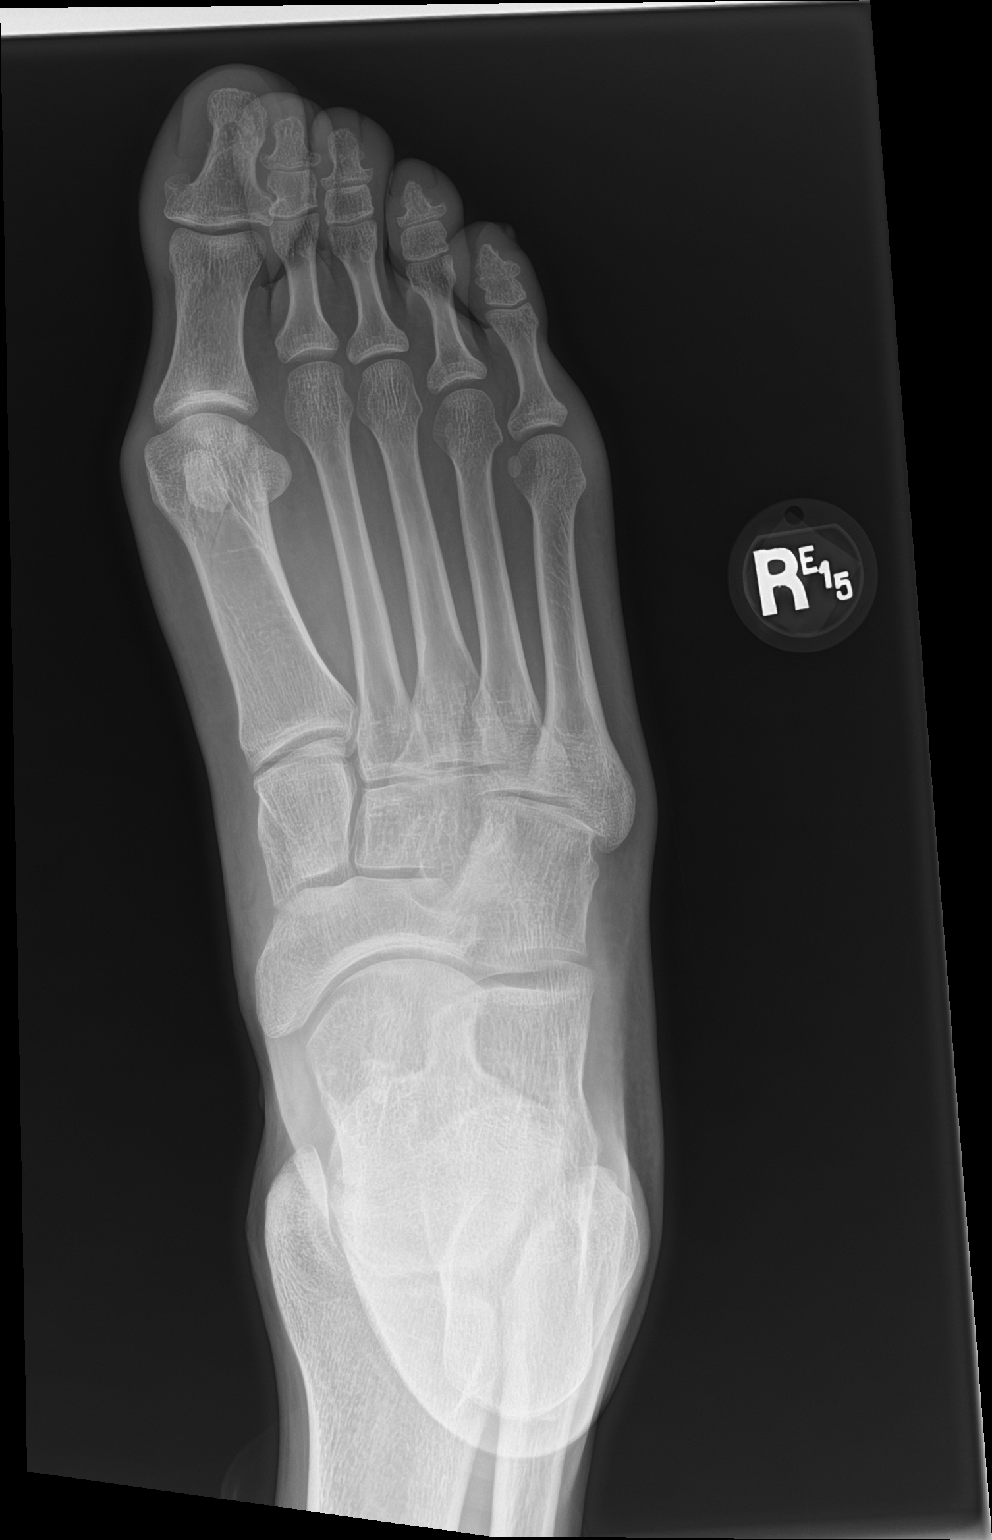

[foot obl]
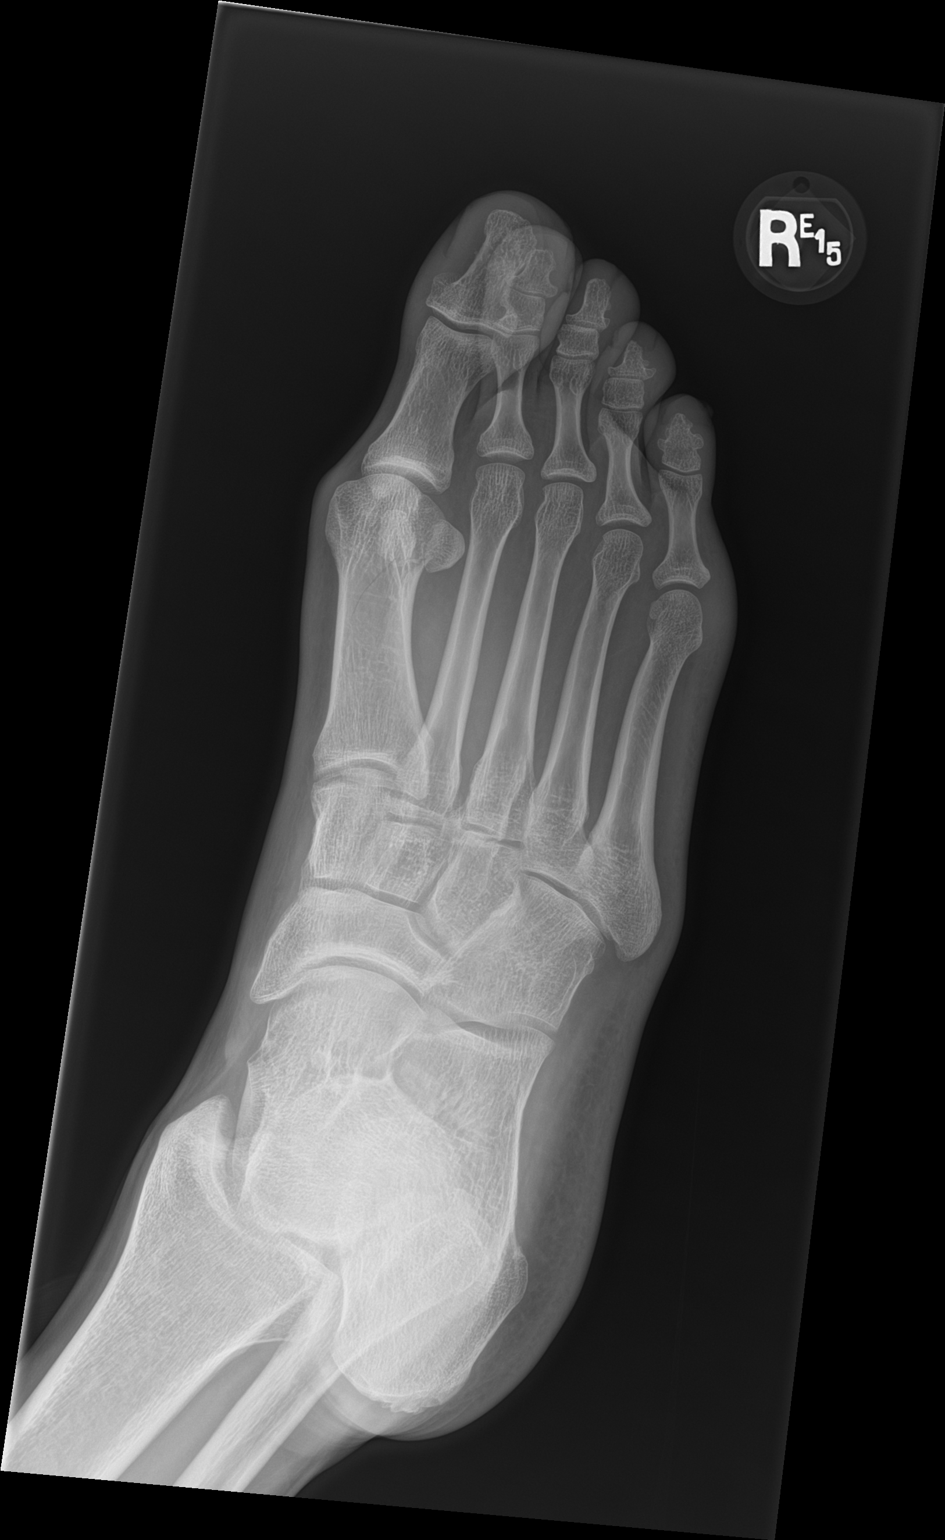

[foot lat]
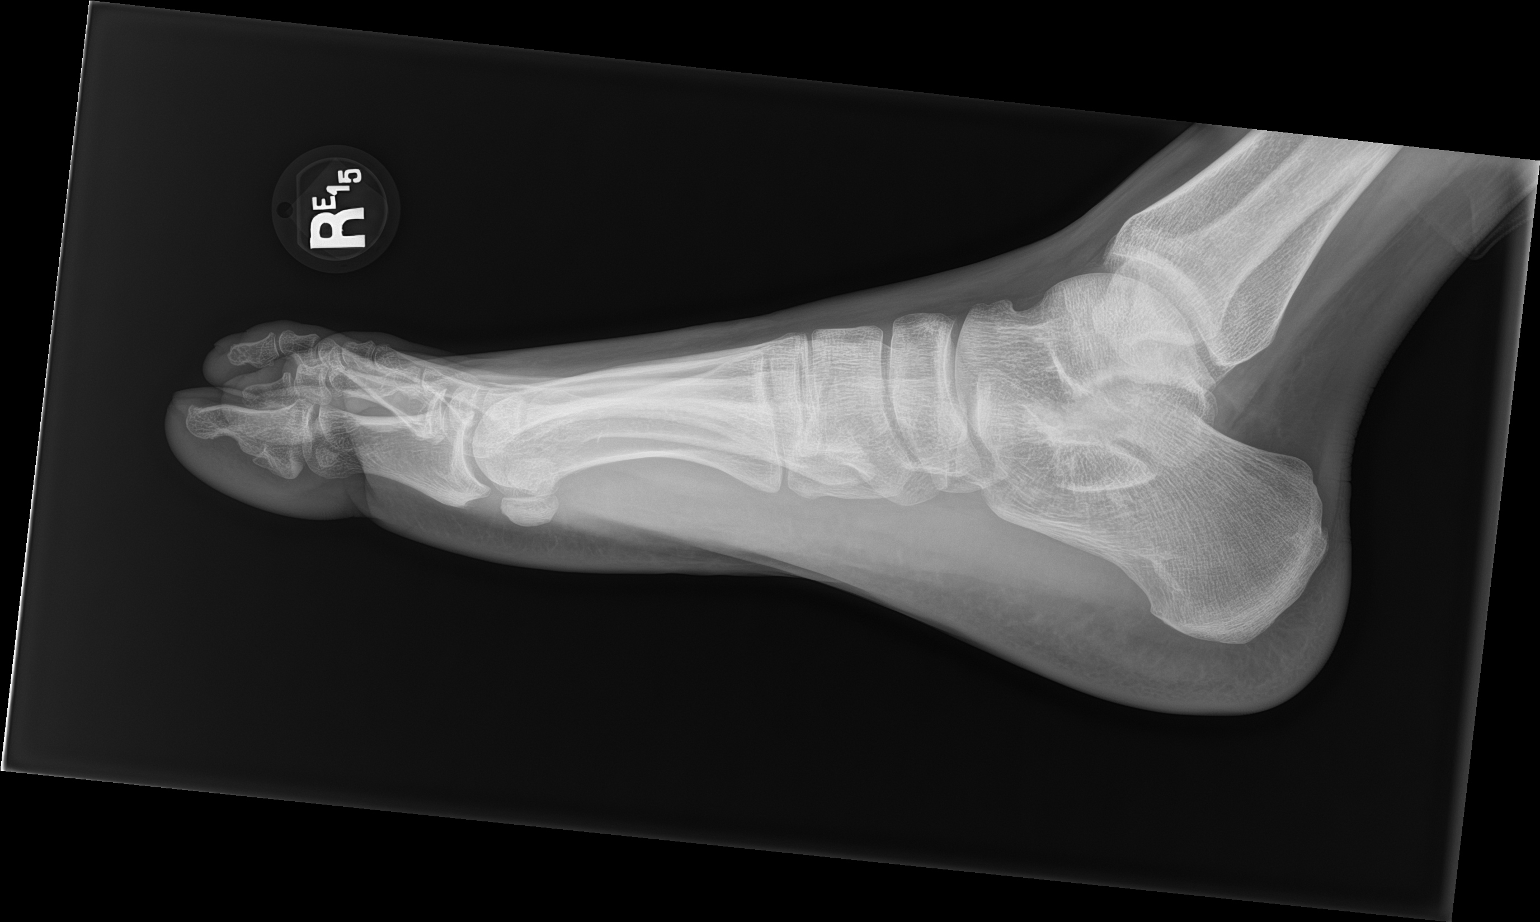

[3 of 3 positions shown; findings below may reference images not displayed]

FINDINGS: There is no evidence of fracture or dislocation. There is no
evidence of arthropathy or other focal bone abnormality. Soft
tissues are unremarkable.
IMPRESSION: Negative.
